# Patient Record
Sex: Male | Born: 1960 | Race: White | Hispanic: No | Marital: Married | Smoking: Current every day smoker
Health system: Southern US, Community
[De-identification: ages and names within clinical notes are randomized; demographics above are authoritative.]

## PROBLEM LIST (undated history)

## (undated) DIAGNOSIS — I10 Essential (primary) hypertension: Secondary | ICD-10-CM

---

## 2008-08-08 ENCOUNTER — Ambulatory Visit: Payer: Self-pay | Admitting: Family Medicine

## 2008-08-08 DIAGNOSIS — Z91038 Other insect allergy status: Secondary | ICD-10-CM

## 2013-10-29 ENCOUNTER — Emergency Department
Admission: EM | Admit: 2013-10-29 | Discharge: 2013-10-29 | Disposition: A | Discharge status: Home / Self Care | Payer: Managed Care, Other (non HMO) | Source: Home / Self Care | Attending: Family Medicine | Admitting: Family Medicine

## 2013-10-29 ENCOUNTER — Encounter: Payer: Self-pay | Admitting: Emergency Medicine

## 2013-10-29 DIAGNOSIS — L03119 Cellulitis of unspecified part of limb: Secondary | ICD-10-CM

## 2013-10-29 HISTORY — DX: Essential (primary) hypertension: I10

## 2013-10-29 MED ORDER — CEPHALEXIN 500 MG PO CAPS: 500.0000 mg | ORAL_CAPSULE | Freq: Four times a day (QID) | ORAL | Status: DC

## 2013-10-29 NOTE — ED Provider Notes (Signed)
Daniel Henry is a 53 y.o. male who presents to Urgent Care today for left foot redness and swelling. About a week ago patient was exposed to bedbugs while traveling. He did well until 2 days ago when he caught a small papule just at the interdigital webspace between the left fourth and fifth toes. Over the next day the skin became to erythematous and swollen and mildly tender and itchy. He took some prednisone that he was prescribed for his bedbugs they had not yet taken today which seemed to help control the swelling and itching. He denies any fevers or chills nausea vomiting or diarrhea.   Past Medical History  Diagnosis Date  . Hypertension    History  Substance Use Topics  . Smoking status: Current Every Day Smoker -- 1.00 packs/day    Types: Cigarettes  . Smokeless tobacco: Never Used  . Alcohol Use: No   ROS as above Medications: No current facility-administered medications for this encounter.   Current Outpatient Prescriptions  Medication Sig Dispense Refill  . predniSONE (DELTASONE) 20 MG tablet Take 20 mg by mouth daily with breakfast.      . cephALEXin (KEFLEX) 500 MG capsule Take 1 capsule (500 mg total) by mouth 4 (four) times daily.  28 capsule  0    Exam:  BP 144/80  Pulse 97  Temp(Src) 98.2 F (36.8 C) (Oral)  Ht 6\' 1"  (1.854 m)  Wt 235 lb 8 oz (106.822 kg)  BMI 31.08 kg/m2  SpO2 96% Gen: Well NAD HEENT: EOMI,  MMM Lungs: Normal work of breathing. CTABL Heart: RRR no MRG Abd: NABS, Soft. Nondistended, Nontender Exts: Brisk capillary refill, warm and well perfused.  Skin: Left foot 3 cm area of erythema with mild tenderness. No induration or fluctuance. Area localized at the interdigital webspace between the fourth and fifth toes and along the dorsal aspect of the lateral foot  No results found for this or any previous visit (from the past 24 hour(s)). No results found.  Assessment and Plan: 53 y.o. male with cellulitis most likely explanation. Treat with  Keflex. Followup as needed.  Discussed warning signs or symptoms. Please see discharge instructions. Patient expresses understanding.     Rodolph BongEvan S Corey, MD 10/29/13 [phone]

## 2013-10-29 NOTE — Discharge Instructions (Signed)
Thank you for coming in today. Continue prednisone for itching and swelling from bedbugs. Take Keflex 4 times daily. Come back as needed. Call or go to the emergency room if you get worse, have trouble breathing, have chest pains, or palpitations.   Cellulitis Cellulitis is an infection of the skin and the tissue beneath it. The infected area is usually red and tender. Cellulitis occurs most often in the arms and lower legs.  CAUSES  Cellulitis is caused by bacteria that enter the skin through cracks or cuts in the skin. The most common types of bacteria that cause cellulitis are staphylococci and streptococci. SIGNS AND SYMPTOMS   Redness and warmth.  Swelling.  Tenderness or pain.  Fever. DIAGNOSIS  Your health care provider can usually determine what is wrong based on a physical exam. Blood tests may also be done. TREATMENT  Treatment usually involves taking an antibiotic medicine. HOME CARE INSTRUCTIONS   Take your antibiotic medicine as directed by your health care provider. Finish the antibiotic even if you start to feel better.  Keep the infected arm or leg elevated to reduce swelling.  Apply a warm cloth to the affected area up to 4 times per day to relieve pain.  Take medicines only as directed by your health care provider.  Keep all follow-up visits as directed by your health care provider. SEEK MEDICAL CARE IF:   You notice red streaks coming from the infected area.  Your red area gets larger or turns dark in color.  Your bone or joint underneath the infected area becomes painful after the skin has healed.  Your infection returns in the same area or another area.  You notice a swollen bump in the infected area.  You develop new symptoms.  You have a fever. SEEK IMMEDIATE MEDICAL CARE IF:   You feel very sleepy.  You develop vomiting or diarrhea.  You have a general ill feeling (malaise) with muscle aches and pains. MAKE SURE YOU:   Understand these  instructions.  Will watch your condition.  Will get help right away if you are not doing well or get worse. Document Released: 10/22/2004 Document Revised: 05/29/2013 Document Reviewed: 03/30/2011 [facility] Patient Information 2015 [employer] [place]. This information is not intended to replace advice given to you by your health care provider. Make sure you discuss any questions you have with your health care provider.

## 2013-10-29 NOTE — ED Notes (Signed)
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  Pt has not had any new bites since he left and washed all of his things.  Pt developed a small blister between his toes 2 days ago that he popped with clear liquid coming out. The top of the foot is now red and swollen but pain free.

## 2014-03-01 ENCOUNTER — Encounter (HOSPITAL_COMMUNITY): Payer: Self-pay | Admitting: Emergency Medicine

## 2014-03-01 ENCOUNTER — Inpatient Hospital Stay (HOSPITAL_COMMUNITY)
Admission: EM | Admit: 2014-03-01 | Discharge: 2014-03-03 | DRG: 603 | Disposition: A | Discharge status: Home / Self Care | Payer: Managed Care, Other (non HMO) | Attending: Internal Medicine | Admitting: Internal Medicine

## 2014-03-01 DIAGNOSIS — I10 Essential (primary) hypertension: Secondary | ICD-10-CM | POA: Diagnosis present

## 2014-03-01 DIAGNOSIS — L03116 Cellulitis of left lower limb: Secondary | ICD-10-CM | POA: Diagnosis not present

## 2014-03-01 DIAGNOSIS — R21 Rash and other nonspecific skin eruption: Secondary | ICD-10-CM | POA: Diagnosis not present

## 2014-03-01 DIAGNOSIS — Z7982 Long term (current) use of aspirin: Secondary | ICD-10-CM

## 2014-03-01 DIAGNOSIS — M7989 Other specified soft tissue disorders: Secondary | ICD-10-CM

## 2014-03-01 DIAGNOSIS — L0390 Cellulitis, unspecified: Secondary | ICD-10-CM | POA: Diagnosis present

## 2014-03-01 DIAGNOSIS — F17210 Nicotine dependence, cigarettes, uncomplicated: Secondary | ICD-10-CM | POA: Diagnosis present

## 2014-03-01 DIAGNOSIS — I891 Lymphangitis: Secondary | ICD-10-CM

## 2014-03-01 DIAGNOSIS — Z716 Tobacco abuse counseling: Secondary | ICD-10-CM | POA: Diagnosis present

## 2014-03-01 LAB — COMPREHENSIVE METABOLIC PANEL
ALT: 25 U/L (ref 0–53)
ANION GAP: 10 (ref 5–15)
AST: 28 U/L (ref 0–37)
Albumin: 3.6 g/dL (ref 3.5–5.2)
Alkaline Phosphatase: 93 U/L (ref 39–117)
BUN: 12 mg/dL (ref 6–23)
CALCIUM: 9 mg/dL (ref 8.4–10.5)
CO2: 25 mmol/L (ref 19–32)
Chloride: 102 mmol/L (ref 96–112)
Creatinine, Ser: 1.24 mg/dL (ref 0.50–1.35)
GFR calc non Af Amer: 64 mL/min — ABNORMAL LOW (ref >90)
GFR, EST AFRICAN AMERICAN: 75 mL/min — AB (ref >90)
GLUCOSE: 109 mg/dL — AB (ref 70–99)
Potassium: 3.5 mmol/L (ref 3.5–5.1)
Sodium: 137 mmol/L (ref 135–145)
Total Bilirubin: 0.8 mg/dL (ref 0.3–1.2)
Total Protein: 7.5 g/dL (ref 6.0–8.3)

## 2014-03-01 LAB — CBC WITH DIFFERENTIAL/PLATELET
Basophils Absolute: 0 10*3/uL (ref 0.0–0.1)
Basophils Relative: 0 % (ref 0–1)
EOS ABS: 0.1 10*3/uL (ref 0.0–0.7)
EOS PCT: 1 % (ref 0–5)
HEMATOCRIT: 41.5 % (ref 39.0–52.0)
HEMOGLOBIN: 14.7 g/dL (ref 13.0–17.0)
Lymphocytes Relative: 11 % — ABNORMAL LOW (ref 12–46)
Lymphs Abs: 1.4 10*3/uL (ref 0.7–4.0)
MCH: 31.7 pg (ref 26.0–34.0)
MCHC: 35.4 g/dL (ref 30.0–36.0)
MCV: 89.4 fL (ref 78.0–100.0)
MONOS PCT: 8 % (ref 3–12)
Monocytes Absolute: 1.1 10*3/uL — ABNORMAL HIGH (ref 0.1–1.0)
NEUTROS ABS: 10 10*3/uL — AB (ref 1.7–7.7)
Neutrophils Relative %: 80 % — ABNORMAL HIGH (ref 43–77)
Platelets: 212 10*3/uL (ref 150–400)
RBC: 4.64 MIL/uL (ref 4.22–5.81)
RDW: 13.2 % (ref 11.5–15.5)
WBC: 12.6 10*3/uL — ABNORMAL HIGH (ref 4.0–10.5)

## 2014-03-01 LAB — CBC
HCT: 40.1 % (ref 39.0–52.0)
HEMOGLOBIN: 13.8 g/dL (ref 13.0–17.0)
MCH: 31.2 pg (ref 26.0–34.0)
MCHC: 34.4 g/dL (ref 30.0–36.0)
MCV: 90.7 fL (ref 78.0–100.0)
Platelets: 190 10*3/uL (ref 150–400)
RBC: 4.42 MIL/uL (ref 4.22–5.81)
RDW: 13.3 % (ref 11.5–15.5)
WBC: 12.4 10*3/uL — ABNORMAL HIGH (ref 4.0–10.5)

## 2014-03-01 LAB — I-STAT CG4 LACTIC ACID, ED: LACTIC ACID, VENOUS: 1.11 mmol/L (ref 0.5–2.0)

## 2014-03-01 MED ORDER — ENOXAPARIN SODIUM 120 MG/0.8ML ~~LOC~~ SOLN
1.0000 mg/kg | Freq: Once | SUBCUTANEOUS | Status: AC
  Administered 2014-03-01: 105 mg via SUBCUTANEOUS
  Filled 2014-03-01: qty 0.8

## 2014-03-01 MED ORDER — MORPHINE SULFATE 4 MG/ML IJ SOLN
4.0000 mg | Freq: Once | INTRAMUSCULAR | Status: AC
  Administered 2014-03-01: 4 mg via INTRAVENOUS
  Filled 2014-03-01: qty 1

## 2014-03-01 MED ORDER — MORPHINE SULFATE 2 MG/ML IJ SOLN
2.0000 mg | INTRAMUSCULAR | Status: DC | PRN
  Administered 2014-03-02: 2 mg via INTRAVENOUS
  Filled 2014-03-01: qty 1

## 2014-03-01 MED ORDER — CLINDAMYCIN PHOSPHATE 600 MG/50ML IV SOLN
600.0000 mg | Freq: Once | INTRAVENOUS | Status: AC
  Administered 2014-03-01: 600 mg via INTRAVENOUS
  Filled 2014-03-01: qty 50

## 2014-03-01 MED ORDER — SODIUM CHLORIDE 0.9 % IV BOLUS (SEPSIS)
1000.0000 mL | Freq: Once | INTRAVENOUS | Status: AC
Start: 2014-03-01 — End: 2014-03-01
  Administered 2014-03-01: 1000 mL via INTRAVENOUS

## 2014-03-01 MED ORDER — HEPARIN SODIUM (PORCINE) 5000 UNIT/ML IJ SOLN
5000.0000 [IU] | Freq: Three times a day (TID) | INTRAMUSCULAR | Status: DC
  Administered 2014-03-02 – 2014-03-03 (×4): 5000 [IU] via SUBCUTANEOUS
  Filled 2014-03-01 (×6): qty 1

## 2014-03-01 NOTE — H&P (Signed)
Hospitalist Admission History and Physical  Patient name: [name] Medical record number: [id number] Date of birth: 19-Sep-1960 Age: 54 y.o. Gender: male  Primary Care Provider: Cassie Freer, MD  Chief Complaint: LE swelling, lymphangitis, skin rash.   History of Present Illness:This is a 54 y.o. year old male with significant past medical history of tobacco abuse presenting with  LE swelling, lymphangitis, skin rash. 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 Patient cannot confirm this believes he may been bitten by a spider. Patient states that when he was driving back from [place] he noticed progressive right lower extremity swelling. Minimal to mild pain. Denies any fevers or chills. No itching. No noted lesion to do note biting. Presents to the ER temperature 90.9, heart rate in the 110s, respirations tens, blood pressure in the 150s. Satting 95% on room air. White blood cell count 12.6. Otherwise blood work within normal limits.        Assessment and Plan: Daniel Henry is a 54 y.o. year old male presenting with LE swelling, lymphangitis, skin rash.   Active Problems:   Rash, skin   Swelling of lower extremity   Lymphangitis   1- Skin rash, LE swelling, lymphangitis -Differential diagnosis includes cellulitis, lower extremity DVT, skin reaction  -s/p treatment dose lovenox -IV clindamycin -LE u/s in am  -follow  2- tobacco abuse  -quit smoking -tobacco abuse counseling  FEN/GI: heart healthy diet Prophylaxis: lovenox  Disposition: pending further evaluation  Code Status:Full Code    Patient Active Problem List   Diagnosis Date Noted  . Rash, skin 03/01/2014  . ALLERGY TO INSECTS AND ARACHNIDS 08/08/2008   Past Medical History: Past Medical History  Diagnosis Date  . Hypertension     Past Surgical History: History reviewed. No pertinent past surgical history.  Social History: History    Social History  . Marital Status: Married    Spouse Name: N/A    Number of Children: N/A  . Years of Education: N/A   Social History Main Topics  . Smoking status: Current Every Day Smoker -- 1.00 packs/day    Types: Cigarettes  . Smokeless tobacco: Never Used  . Alcohol Use: No  . Drug Use: No  . Sexual Activity: None   Other Topics Concern  . None   Social History Narrative    Family History: No family history on file.  Allergies: No Known Allergies  Current Facility-Administered Medications  Medication Dose Route Frequency Provider Last Rate Last Dose  . clindamycin (CLEOCIN) IVPB 600 mg  600 mg Intravenous Once Richardean Canal, MD 100 mL/hr at 03/01/14 2241 600 mg at 03/01/14 2241  . heparin injection 5,000 Units  5,000 Units Subcutaneous 3 times per day Doree Albee, MD      . morphine 2 MG/ML injection 2-4 mg  2-4 mg Intravenous Q3H PRN Doree Albee, MD       Current Outpatient Prescriptions  Medication Sig Dispense Refill  . aspirin EC 81 MG tablet Take 81 mg by mouth daily.    . Multiple Vitamin (ONE-A-DAY MENS PO) Take 1 tablet by mouth daily.    Marland Kitchen RESVERATROL PO Take 2 tablets by mouth once a week.    . cephALEXin (KEFLEX) 500 MG capsule Take 1 capsule (500 mg total) by mouth 4 (four) times daily. (Patient not taking: Reported on 03/01/2014) 28 capsule 0  . predniSONE (DELTASONE) 20 MG tablet Take 20 mg by mouth daily with breakfast.  Review Of Systems: 12 point ROS negative except as noted above in HPI.  Physical Exam: Filed Vitals:   03/01/14 2056  BP: 150/79  Pulse: 110  Temp: 99 F (37.2 C)  Resp: 18    General: alert and cooperative HEENT: PERRLA and extra ocular movement intact Heart: S1, S2 normal, no murmur, rub or gallop, regular rate and rhythm Lungs: clear to auscultation, no wheezes or rales and unlabored breathing Abdomen: abdomen is soft without significant tenderness, masses, organomegaly or guarding Extremities: as above  Skin:as  above  Neurology: normal without focal findings  Labs and Imaging: Lab Results  Component Value Date/Time   NA 137 03/01/2014 09:02 PM   K 3.5 03/01/2014 09:02 PM   CL 102 03/01/2014 09:02 PM   CO2 25 03/01/2014 09:02 PM   BUN 12 03/01/2014 09:02 PM   CREATININE 1.24 03/01/2014 09:02 PM   GLUCOSE 109* 03/01/2014 09:02 PM   Lab Results  Component Value Date   WBC 12.6* 03/01/2014   HGB 14.7 03/01/2014   HCT 41.5 03/01/2014   MCV 89.4 03/01/2014   PLT 212 03/01/2014    No results found.         Doree AlbeeSteven Britton Bera MD  Pager: [phone]

## 2014-03-01 NOTE — ED Notes (Signed)
Pt reports possibly being bit by a spider in the last 24hrs. Pt states he noticed pain to inner left thigh, redness noted to left inner thigh. Pt also states overnight he noticed swelling to lower left leg and foot. Pt reports pain. Leg is warm to touch, red and swollen. Pulses present in foot. 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 Denies any sob or cp.

## 2014-03-01 NOTE — ED Provider Notes (Signed)
CSN: [id number]     Arrival date & time 03/01/14  2038 History   First MD Initiated Contact with Patient 03/01/14 2209     Chief Complaint  Patient presents with  . Cellulitis     (Consider location/radiation/quality/duration/timing/severity/associated sxs/prior Treatment) The history is provided by the patient.  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 He recently drove down to [place] He stated the cabin last night. He drove up today and started having some redness and swelling onset this morning. Also had some left thigh and groin pain as well. He states that he may have bitten by a spider but he wasn't sure. Denies any shortness of breath or chest pain. He has some subjective chills.    Past Medical History  Diagnosis Date  . Hypertension    History reviewed. No pertinent past surgical history. No family history on file. History  Substance Use Topics  . Smoking status: Current Every Day Smoker -- 1.00 packs/day    Types: Cigarettes  . Smokeless tobacco: Never Used  . Alcohol Use: No    Review of Systems  Cardiovascular: Positive for leg swelling.  Musculoskeletal:       L leg cellulitis   All other systems reviewed and are negative.     Allergies  Review of patient's allergies indicates no known allergies.  Home Medications   Prior to Admission medications   Medication Sig Start Date End Date Taking? Authorizing Provider  cephALEXin (KEFLEX) 500 MG capsule Take 1 capsule (500 mg total) by mouth 4 (four) times daily. 10/29/13   Rodolph BongEvan S Corey, MD  predniSONE (DELTASONE) 20 MG tablet Take 20 mg by mouth daily with breakfast.    Historical Provider, MD   BP 150/79 mmHg  Pulse 110  Temp(Src) 99 F (37.2 C) (Oral)  Resp 18  Ht 6\' 1"  (1.854 m)  Wt 235 lb (106.595 kg)  BMI 31.01 kg/m2  SpO2 95% Physical Exam  Constitutional: He is oriented to person, place, and time. He appears well-developed.  Slightly uncomfortable    HENT:  Head: Normocephalic.  Mouth/Throat: Oropharynx is clear and moist.  Eyes: Conjunctivae are normal. Pupils are equal, round, and reactive to light.  Neck: Normal range of motion. Neck supple.  Cardiovascular: Regular rhythm and normal heart sounds.   Tachycardic   Pulmonary/Chest: Effort normal and breath sounds normal. No respiratory distress. He has no wheezes. He has no rales.  Abdominal: Soft. Bowel sounds are normal. He exhibits no distension. There is no tenderness. There is no rebound and no guarding.  Musculoskeletal: Normal range of motion.  L calf swollen. Tender inguinal lymph nodes. Cellulitis to mid calf. No subcutaneous air. 2+ pulses. No obvious skin breakdown   Neurological: He is alert and oriented to person, place, and time.  Skin: Skin is warm and dry.  Psychiatric: He has a normal mood and affect. His behavior is normal. Judgment and thought content normal.  Nursing note and vitals reviewed.   ED Course  Procedures (including critical care time) Labs Review Labs Reviewed  CBC WITH DIFFERENTIAL/PLATELET - Abnormal; Notable for the following:    WBC 12.6 (*)    Neutrophils Relative % 80 (*)    Neutro Abs 10.0 (*)    Lymphocytes Relative 11 (*)    Monocytes Absolute 1.1 (*)    All other components within normal limits  COMPREHENSIVE METABOLIC PANEL - Abnormal; Notable for the following:    Glucose,  Bld 109 (*)    GFR calc non Af Amer 64 (*)    GFR calc Af Amer 75 (*)    All other components within normal limits  CULTURE, BLOOD (ROUTINE X 2)  CULTURE, BLOOD (ROUTINE X 2)  I-STAT CG4 LACTIC ACID, ED    Imaging Review No results found.   EKG Interpretation None      MDM   Final diagnoses:  None    Daniel Henry is a 54 y.o. male here with L leg cellulitis. 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 Given that he is tachy, low grade temp, WBC 12, he meets SIRS criteria. Will give clinda, cultures sent. Unable to get duplex  tonight. Will give a dose of lovenox empirically and admit.    Richardean Canal, MD 03/01/14 2221

## 2014-03-01 NOTE — ED Notes (Signed)
Pt. reports pain , swelling at left lower leg/foot onset this morning denies injury , pedal pulses present , denies fever or chills.

## 2014-03-02 DIAGNOSIS — R21 Rash and other nonspecific skin eruption: Secondary | ICD-10-CM | POA: Diagnosis present

## 2014-03-02 DIAGNOSIS — I10 Essential (primary) hypertension: Secondary | ICD-10-CM | POA: Diagnosis present

## 2014-03-02 DIAGNOSIS — Z716 Tobacco abuse counseling: Secondary | ICD-10-CM | POA: Diagnosis present

## 2014-03-02 DIAGNOSIS — Z7982 Long term (current) use of aspirin: Secondary | ICD-10-CM | POA: Diagnosis not present

## 2014-03-02 DIAGNOSIS — M7989 Other specified soft tissue disorders: Secondary | ICD-10-CM

## 2014-03-02 DIAGNOSIS — I891 Lymphangitis: Secondary | ICD-10-CM

## 2014-03-02 DIAGNOSIS — F17210 Nicotine dependence, cigarettes, uncomplicated: Secondary | ICD-10-CM | POA: Diagnosis present

## 2014-03-02 DIAGNOSIS — L0390 Cellulitis, unspecified: Secondary | ICD-10-CM | POA: Diagnosis present

## 2014-03-02 DIAGNOSIS — L03116 Cellulitis of left lower limb: Secondary | ICD-10-CM | POA: Diagnosis present

## 2014-03-02 LAB — COMPREHENSIVE METABOLIC PANEL
ALBUMIN: 2.9 g/dL — AB (ref 3.5–5.2)
ALT: 20 U/L (ref 0–53)
ANION GAP: 9 (ref 5–15)
AST: 20 U/L (ref 0–37)
Alkaline Phosphatase: 71 U/L (ref 39–117)
BUN: 11 mg/dL (ref 6–23)
CHLORIDE: 104 mmol/L (ref 96–112)
CO2: 26 mmol/L (ref 19–32)
Calcium: 8.4 mg/dL (ref 8.4–10.5)
Creatinine, Ser: 1.17 mg/dL (ref 0.50–1.35)
GFR calc Af Amer: 80 mL/min — ABNORMAL LOW (ref >90)
GFR calc non Af Amer: 69 mL/min — ABNORMAL LOW (ref >90)
GLUCOSE: 98 mg/dL (ref 70–99)
POTASSIUM: 3.6 mmol/L (ref 3.5–5.1)
Sodium: 139 mmol/L (ref 135–145)
Total Bilirubin: 1.1 mg/dL (ref 0.3–1.2)
Total Protein: 6.1 g/dL (ref 6.0–8.3)

## 2014-03-02 LAB — CREATININE, SERUM
Creatinine, Ser: 1.2 mg/dL (ref 0.50–1.35)
GFR calc Af Amer: 78 mL/min — ABNORMAL LOW (ref >90)
GFR calc non Af Amer: 67 mL/min — ABNORMAL LOW (ref >90)

## 2014-03-02 LAB — CBC WITH DIFFERENTIAL/PLATELET
BASOS PCT: 0 % (ref 0–1)
Basophils Absolute: 0 10*3/uL (ref 0.0–0.1)
EOS PCT: 1 % (ref 0–5)
Eosinophils Absolute: 0.1 10*3/uL (ref 0.0–0.7)
HCT: 38.7 % — ABNORMAL LOW (ref 39.0–52.0)
Hemoglobin: 13.2 g/dL (ref 13.0–17.0)
LYMPHS ABS: 1.6 10*3/uL (ref 0.7–4.0)
Lymphocytes Relative: 17 % (ref 12–46)
MCH: 30.8 pg (ref 26.0–34.0)
MCHC: 34.1 g/dL (ref 30.0–36.0)
MCV: 90.4 fL (ref 78.0–100.0)
Monocytes Absolute: 1 10*3/uL (ref 0.1–1.0)
Monocytes Relative: 10 % (ref 3–12)
NEUTROS PCT: 72 % (ref 43–77)
Neutro Abs: 7 10*3/uL (ref 1.7–7.7)
Platelets: 193 10*3/uL (ref 150–400)
RBC: 4.28 MIL/uL (ref 4.22–5.81)
RDW: 13.5 % (ref 11.5–15.5)
WBC: 9.7 10*3/uL (ref 4.0–10.5)

## 2014-03-02 MED ORDER — CLINDAMYCIN PHOSPHATE 600 MG/50ML IV SOLN
600.0000 mg | Freq: Three times a day (TID) | INTRAVENOUS | Status: DC
  Administered 2014-03-02 – 2014-03-03 (×4): 600 mg via INTRAVENOUS
  Filled 2014-03-02 (×6): qty 50

## 2014-03-02 NOTE — Progress Notes (Signed)
[facility] PROGRESS NOTE  Daniel Henry UJW:[id number] DOB: 05/26/1960 DOA: 03/01/2014 PCP: Daniel FreerKELLEY,WILLIAM, MD  Assessment/Plan: 1-Left LE cellulitis;  Continue with clindamycin.  Doppler pending.  Received one dose of Lovenox therapeutic.  Follow blood culture.  WBC trending down.   DVT prophylaxis heparin.   Code Status: full code.  Family Communication: care discussed with patient Disposition Plan: remain inpatient for IV antibiotics.    Consultants:  none  Procedures:  Doppler LE pending.   Antibiotics:  Clindamycin 2-5  HPI/Subjective: Mild improvement of edema, still with pain and redness.   Objective: Filed Vitals:   03/02/14 1322  BP: 116/72  Pulse: 73  Temp: 98.4 F (36.9 C)  Resp: 16    Intake/Output Summary (Last 24 hours) at 03/02/14 1454 Last data filed at 03/02/14 1430  Gross per 24 hour  Intake    360 ml  Output      0 ml  Net    360 ml   Filed Weights   03/01/14 2056 03/02/14 0002  Weight: 106.595 kg (235 lb) 104.69 kg (230 lb 12.8 oz)    Exam:   General:  Alert in no distress.   Cardiovascular: S 1, S 2 RRR  Respiratory: CTA  Abdomen: BS present, soft, nt  Musculoskeletal: left LE with edema, redness.   Data Reviewed: Basic Metabolic Panel:  Recent Labs Lab 03/01/14 2102 03/01/14 2350 03/02/14 0652  NA 137  --  139  K 3.5  --  3.6  CL 102  --  104  CO2 25  --  26  GLUCOSE 109*  --  98  BUN 12  --  11  CREATININE 1.24 1.20 1.17  CALCIUM 9.0  --  8.4   Liver Function Tests:  Recent Labs Lab 03/01/14 2102 03/02/14 0652  AST 28 20  ALT 25 20  ALKPHOS 93 71  BILITOT 0.8 1.1  PROT 7.5 6.1  ALBUMIN 3.6 2.9*   No results for input(s): LIPASE, AMYLASE in the last 168 hours. No results for input(s): AMMONIA in the last 168 hours. CBC:  Recent Labs Lab 03/01/14 2102 03/01/14 2350 03/02/14 0652  WBC 12.6* 12.4* 9.7  NEUTROABS 10.0*  --  7.0  HGB 14.7 13.8 13.2  HCT 41.5 40.1 38.7*  MCV 89.4  90.7 90.4  PLT 212 190 193   Cardiac Enzymes: No results for input(s): CKTOTAL, CKMB, CKMBINDEX, TROPONINI in the last 168 hours. BNP (last 3 results) No results for input(s): BNP in the last 8760 hours.  ProBNP (last 3 results) No results for input(s): PROBNP in the last 8760 hours.  CBG: No results for input(s): GLUCAP in the last 168 hours.  No results found for this or any previous visit (from the past 240 hour(s)).   Studies: No results found.  Scheduled Meds: . clindamycin (CLEOCIN) IV  600 mg Intravenous Q8H  . heparin  5,000 Units Subcutaneous 3 times per day   Continuous Infusions:   Active Problems:   Rash, skin   Swelling of lower extremity   Lymphangitis    Time spent: 35 minutes.     [name], [name]  [facility] Pager [phone]. If 7PM-7AM, please contact night-coverage at [email or web address], password [email or web address] 03/02/2014, 2:54 PM  LOS: 1 day

## 2014-03-02 NOTE — Progress Notes (Signed)
Pt transferred to unit from ED via NT x 1. Pt alert and oriented upon arrival. No signs or symptoms of acute distress. Pt oriented to unit, as well as unit procedures. Pt now resting in bed at lowest position, bed alarm on, call light in reach. Will continue to monitor. Delfino Lovettichie Caniyah Murley, RN, BSN 03/02/2014 12:01 AM

## 2014-03-02 NOTE — Progress Notes (Signed)
VASCULAR LAB PRELIMINARY  PRELIMINARY  PRELIMINARY  PRELIMINARY  Lower Extremity Venous Duplex LEFT completed.    Preliminary report:  NEG DVT.  Redmond Pullingltzroth, Shantice Menger F, RVT 03/02/2014, 4:55 PM

## 2014-03-02 NOTE — Progress Notes (Signed)
UR completed 

## 2014-03-03 LAB — COMPREHENSIVE METABOLIC PANEL
ALT: 17 U/L (ref 0–53)
AST: 16 U/L (ref 0–37)
Albumin: 2.8 g/dL — ABNORMAL LOW (ref 3.5–5.2)
Alkaline Phosphatase: 78 U/L (ref 39–117)
Anion gap: 7 (ref 5–15)
BUN: 11 mg/dL (ref 6–23)
CALCIUM: 8.5 mg/dL (ref 8.4–10.5)
CHLORIDE: 107 mmol/L (ref 96–112)
CO2: 26 mmol/L (ref 19–32)
CREATININE: 1.08 mg/dL (ref 0.50–1.35)
GFR calc Af Amer: 88 mL/min — ABNORMAL LOW (ref >90)
GFR, EST NON AFRICAN AMERICAN: 76 mL/min — AB (ref >90)
Glucose, Bld: 84 mg/dL (ref 70–99)
Potassium: 3.7 mmol/L (ref 3.5–5.1)
Sodium: 140 mmol/L (ref 135–145)
Total Bilirubin: 1.2 mg/dL (ref 0.3–1.2)
Total Protein: 5.8 g/dL — ABNORMAL LOW (ref 6.0–8.3)

## 2014-03-03 LAB — CBC WITH DIFFERENTIAL/PLATELET
Basophils Absolute: 0 10*3/uL (ref 0.0–0.1)
Basophils Relative: 0 % (ref 0–1)
EOS PCT: 2 % (ref 0–5)
Eosinophils Absolute: 0.2 10*3/uL (ref 0.0–0.7)
HCT: 37.7 % — ABNORMAL LOW (ref 39.0–52.0)
Hemoglobin: 12.7 g/dL — ABNORMAL LOW (ref 13.0–17.0)
Lymphocytes Relative: 27 % (ref 12–46)
Lymphs Abs: 2 10*3/uL (ref 0.7–4.0)
MCH: 30.8 pg (ref 26.0–34.0)
MCHC: 33.7 g/dL (ref 30.0–36.0)
MCV: 91.3 fL (ref 78.0–100.0)
MONO ABS: 0.9 10*3/uL (ref 0.1–1.0)
MONOS PCT: 12 % (ref 3–12)
Neutro Abs: 4.4 10*3/uL (ref 1.7–7.7)
Neutrophils Relative %: 59 % (ref 43–77)
Platelets: 225 10*3/uL (ref 150–400)
RBC: 4.13 MIL/uL — ABNORMAL LOW (ref 4.22–5.81)
RDW: 13.6 % (ref 11.5–15.5)
WBC: 7.4 10*3/uL (ref 4.0–10.5)

## 2014-03-03 LAB — HEMOGLOBIN A1C
Hgb A1c MFr Bld: 5.9 % — ABNORMAL HIGH (ref 4.8–5.6)
Mean Plasma Glucose: 123 mg/dL

## 2014-03-03 MED ORDER — CLINDAMYCIN HCL 300 MG PO CAPS: 600.0000 mg | ORAL_CAPSULE | Freq: Three times a day (TID) | ORAL | Status: DC

## 2014-03-03 MED ORDER — HYDROCODONE-ACETAMINOPHEN 5-325 MG PO TABS: 1.0000 | ORAL_TABLET | Freq: Four times a day (QID) | ORAL | Status: DC | PRN

## 2014-03-03 NOTE — Discharge Summary (Signed)
Physician Discharge Summary  Daniel Henry [id number]:[id number] DOB: 10/11/1960 DOA: 03/01/2014  PCP: Cassie FreerKELLEY,WILLIAM, MD  Admit date: 03/01/2014 Discharge date: 03/03/2014  Time spent: 35 minutes  Recommendations for Outpatient Follow-up:  Need continue diet education.  Repeat Hb -A1 c   Discharge Diagnoses:    Left LE Cellulitis   Discharge Condition: stable.   Diet recommendation: Carb modified diet.   Filed Weights   03/01/14 2056 03/02/14 0002  Weight: 106.595 kg (235 lb) 104.69 kg (230 lb 12.8 oz)    History of present illness:  History of Present Illness:This is a 54 y.o. year old male with significant past medical history of tobacco abuse presenting with LE swelling, lymphangitis, skin rash. 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 Patient cannot confirm this believes he may been bitten by a spider. Patient states that when he was driving back from [place] he noticed progressive right lower extremity swelling. Minimal to mild pain. Denies any fevers or chills. No itching. No noted lesion to do note biting. Presents to the ER temperature 90.9, heart rate in the 110s, respirations tens, blood pressure in the 150s. Satting 95% on room air. White blood cell count 12.6. Otherwise blood work within normal limits.  Hospital Course:  1-Left LE cellulitis;  Continue with clindamycin. swelling improved and redness decrease. He is feeling better. Will discharge patient on clindamycin 7 more days,.  Doppler negative Received one dose of Lovenox therapeutic.  blood culture no growth to date.  WBC trending down.  HBA1c at 5.9, patient aware, education regarding diet provided.   DVT prophylaxis heparin.   Procedures:  Doppler; negative for DVT  Consultations:  none  Discharge Exam: Filed Vitals:   03/03/14 0548  BP: 118/65  Pulse: 72  Temp: 97.9 F (36.6 C)  Resp: 18    General: NAD Cardiovascular: S 1, S 2 RRR Respiratory:  CTA Skin; left LE with decrease edema, redness.   Discharge Instructions   Discharge Instructions    Diet - low sodium heart healthy    Complete by:  As directed      Increase activity slowly    Complete by:  As directed           Current Discharge Medication List    START taking these medications   Details  clindamycin (CLEOCIN) 300 MG capsule Take 2 capsules (600 mg total) by mouth 3 (three) times daily. Qty: 36 capsule, Refills: 0      CONTINUE these medications which have NOT CHANGED   Details  aspirin EC 81 MG tablet Take 81 mg by mouth daily.    Multiple Vitamin (ONE-A-DAY MENS PO) Take 1 tablet by mouth daily.      STOP taking these medications     RESVERATROL PO      cephALEXin (KEFLEX) 500 MG capsule      predniSONE (DELTASONE) 20 MG tablet        No Known Allergies Follow-up Information    Follow up with KELLEY,WILLIAM, MD In 1 week.       The results of significant diagnostics from this hospitalization (including imaging, microbiology, ancillary and laboratory) are listed below for reference.    Significant Diagnostic Studies: No results found.  Microbiology: Recent Results (from the past 240 hour(s))  Blood culture (routine x 2)     Status: None (Preliminary result)   Collection Time: 03/01/14 10:22 PM  Result Value Ref Range Status   Specimen Description BLOOD  RIGHT ARM  Final   Special Requests BOTTLES DRAWN AEROBIC AND ANAEROBIC 5CC  Final   Culture   Final           BLOOD CULTURE RECEIVED NO GROWTH TO DATE CULTURE WILL BE HELD FOR 5 DAYS BEFORE ISSUING A FINAL NEGATIVE REPORT Performed at [facility]    Report Status PENDING  Incomplete  Blood culture (routine x 2)     Status: None (Preliminary result)   Collection Time: 03/01/14 10:24 PM  Result Value Ref Range Status   Specimen Description BLOOD LEFT ARM  Final   Special Requests BOTTLES DRAWN AEROBIC AND ANAEROBIC 5CC  Final   Culture   Final           BLOOD CULTURE  RECEIVED NO GROWTH TO DATE CULTURE WILL BE HELD FOR 5 DAYS BEFORE ISSUING A FINAL NEGATIVE REPORT Performed at [facility]    Report Status PENDING  Incomplete     Labs: Basic Metabolic Panel:  Recent Labs Lab 03/01/14 2102 03/01/14 2350 03/02/14 0652 03/03/14 0445  NA 137  --  139 140  K 3.5  --  3.6 3.7  CL 102  --  104 107  CO2 25  --  26 26  GLUCOSE 109*  --  98 84  BUN 12  --  11 11  CREATININE 1.24 1.20 1.17 1.08  CALCIUM 9.0  --  8.4 8.5   Liver Function Tests:  Recent Labs Lab 03/01/14 2102 03/02/14 0652 03/03/14 0445  AST ALT ALKPHOS 93 71 78  BILITOT 0.8 1.1 1.2  PROT 7.5 6.1 5.8*  ALBUMIN 3.6 2.9* 2.8*   No results for input(s): LIPASE, AMYLASE in the last 168 hours. No results for input(s): AMMONIA in the last 168 hours. CBC:  Recent Labs Lab 03/01/14 2102 03/01/14 2350 03/02/14 0652 03/03/14 0445  WBC 12.6* 12.4* 9.7 7.4  NEUTROABS 10.0*  --  7.0 4.4  HGB 14.7 13.8 13.2 12.7*  HCT 41.5 40.1 38.7* 37.7*  MCV 89.4 90.7 90.4 91.3  PLT 212 190 193 225   Cardiac Enzymes: No results for input(s): CKTOTAL, CKMB, CKMBINDEX, TROPONINI in the last 168 hours. BNP: BNP (last 3 results) No results for input(s): BNP in the last 8760 hours.  ProBNP (last 3 results) No results for input(s): PROBNP in the last 8760 hours.  CBG: No results for input(s): GLUCAP in the last 168 hours.     SignedHartley Barefoot A  [facility] 03/03/2014, 12:40 PM

## 2014-03-03 NOTE — Progress Notes (Signed)
03/03/14 Patient being discharged home today. IV site removed and discharge instructions reviewed with patient.

## 2014-03-08 LAB — CULTURE, BLOOD (ROUTINE X 2)
Culture: NO GROWTH
Culture: NO GROWTH

## 2015-01-05 ENCOUNTER — Emergency Department (INDEPENDENT_AMBULATORY_CARE_PROVIDER_SITE_OTHER)
Admission: EM | Admit: 2015-01-05 | Discharge: 2015-01-05 | Disposition: A | Discharge status: Home / Self Care | Payer: Managed Care, Other (non HMO) | Source: Home / Self Care | Attending: Family Medicine | Admitting: Family Medicine

## 2015-01-05 ENCOUNTER — Encounter: Payer: Self-pay | Admitting: Emergency Medicine

## 2015-01-05 DIAGNOSIS — H6091 Unspecified otitis externa, right ear: Secondary | ICD-10-CM | POA: Diagnosis not present

## 2015-01-05 DIAGNOSIS — H6121 Impacted cerumen, right ear: Secondary | ICD-10-CM | POA: Diagnosis not present

## 2015-01-05 MED ORDER — CIPROFLOXACIN-DEXAMETHASONE 0.3-0.1 % OT SUSP: 4.0000 [drp] | Freq: Two times a day (BID) | OTIC | Status: DC

## 2015-01-05 NOTE — Discharge Instructions (Signed)
May take Ibuprofen 200mg , 4 tabs every 8 hours with food.  If symptoms become significantly worse during the night or over the weekend, proceed to the local emergency room.    Ear Drops, Adult You have been diagnosed with a condition requiring you to put drops of medicine into your outer ear. HOME CARE INSTRUCTIONS   Put drops in the affected ear as instructed. After putting the drops in, you will need to lie down with the affected ear facing up for ten minutes so the drops will remain in the ear canal and run down and fill the canal. Continue using the ear drops for as long as directed by your health care provider.  Prior to getting up, put a cotton ball gently in your ear canal. Leave enough of the cotton ball out so it can be easily removed. Do not attempt to push this down into the canal with a cotton-tipped swab or other instrument.  Do not irrigate or wash out your ears if you have had a perforated eardrum or mastoid surgery, or unless instructed to do so by your health care provider.  Keep appointments with your health care provider as instructed.  Finish all medicine, or use for the length of time prescribed by your health care provider. Continue the drops even if your problem seems to be doing well after a couple days, or continue as instructed. SEEK MEDICAL CARE IF:  You become worse or develop increasing pain.  You notice any unusual drainage from your ear (particularly if the drainage has a bad smell).  You develop hearing difficulties.  You experience a serious form of dizziness in which you feel as if the room is spinning, and you feel nauseated (vertigo).  The outside of your ear becomes red or swollen or both. This may be a sign of an allergic reaction. MAKE SURE YOU:   Understand these instructions.  Will watch your condition.  Will get help right away if you are not doing well or get worse.   This information is not intended to replace advice given to you by your  health care provider. Make sure you discuss any questions you have with your health care provider.   Document Released: 01/06/2001 Document Revised: 02/02/2014 Document Reviewed: 08/09/2012 Elsevier Interactive Patient Education [employer]2016 [employer] Inc[employer]

## 2015-01-05 NOTE — ED Provider Notes (Signed)
CSN: [id number]     Arrival date & time 01/05/15  1015 History   First MD Initiated Contact with Patient 01/05/15 1123     Chief Complaint  Patient presents with  . Otalgia      HPI Comments: Patient's right ear felt clogged about five days ago.  He attempted to clear his right ear with Debrox.  He subsequently had persistent right ear blockage, and now increasing pain.  The history is provided by the patient.    Past Medical History  Diagnosis Date  . Hypertension    History reviewed. No pertinent past surgical history. No family history on file. Social History  Substance Use Topics  . Smoking status: Current Every Day Smoker -- 1.00 packs/day    Types: Cigarettes  . Smokeless tobacco: Never Used  . Alcohol Use: No    Review of Systems No sore throat No cough No pleuritic pain No wheezing No nasal congestion No post-nasal drainage No sinus pain/pressure No itchy/red eyes + right earache No hemoptysis No SOB No fever/chills No nausea No vomiting No abdominal pain No diarrhea No urinary symptoms No skin rash No fatigue No myalgias No headache Used OTC meds without relief  Allergies  Review of patient's allergies indicates no known allergies.  Home Medications   Prior to Admission medications   Medication Sig Start Date End Date Taking? Authorizing Provider  ciprofloxacin-dexamethasone (CIPRODEX) otic suspension Place 4 drops into the right ear 2 (two) times daily. Use for one week 01/05/15   Lattie HawStephen A [place] Mcphee, MD   Meds Ordered and Administered this Visit  Medications - No data to display  BP 142/76 mmHg  Pulse 79  Temp(Src) 97.7 F (36.5 C) (Oral)  Ht 6\' 1"  (1.854 m)  Wt 225 lb 4 oz (102.173 kg)  BMI 29.72 kg/m2  SpO2 95% No data found.   Physical Exam Nursing notes and Vital Signs reviewed. Appearance:  Patient appears stated age, and in no acute distress Eyes:  Pupils are equal, round, and reactive to light and accomodation.  Extraocular  movement is intact.  Conjunctivae are not inflamed  Ears:  Left canal and tympanic membrane normal.  Right canal occluded by moist exudate and cerumen.  Unable to visualize right tympanic membrane. Nose:   Normal turbinates.  No sinus tenderness.    Pharynx:  Normal Neck:  Supple.  No adenopathy    ED Course  Procedures  None   MDM   1. Otitis externa, right   2. Cerumen impaction, right    Begin Ciprodex suspension May take Ibuprofen 200mg , 4 tabs every 8 hours with food.  If symptoms become significantly worse during the night or over the weekend, proceed to the local emergency room.  Followup with Family Doctor in one week for ear lavage    Lattie HawStephen A Ruth Tully, MD 01/11/15 [phone]

## 2015-01-05 NOTE — ED Notes (Signed)
Pt c/o right ear pain, possible ear infection.  No runny nose, no congestion.

## 2015-02-25 ENCOUNTER — Emergency Department (INDEPENDENT_AMBULATORY_CARE_PROVIDER_SITE_OTHER)
Admission: EM | Admit: 2015-02-25 | Discharge: 2015-02-25 | Disposition: A | Discharge status: Home / Self Care | Payer: Managed Care, Other (non HMO) | Source: Home / Self Care | Attending: Family Medicine | Admitting: Family Medicine

## 2015-02-25 ENCOUNTER — Encounter: Payer: Self-pay | Admitting: *Deleted

## 2015-02-25 DIAGNOSIS — H6123 Impacted cerumen, bilateral: Secondary | ICD-10-CM | POA: Diagnosis not present

## 2015-02-25 DIAGNOSIS — H6091 Unspecified otitis externa, right ear: Secondary | ICD-10-CM | POA: Diagnosis not present

## 2015-02-25 MED ORDER — CIPROFLOXACIN-DEXAMETHASONE 0.3-0.1 % OT SUSP: 4.0000 [drp] | Freq: Two times a day (BID) | OTIC | Status: DC

## 2015-02-25 MED ORDER — AMOXICILLIN-POT CLAVULANATE 875-125 MG PO TABS: 1.0000 | ORAL_TABLET | Freq: Two times a day (BID) | ORAL | Status: DC

## 2015-02-25 NOTE — ED Notes (Signed)
Pt c/o RT ear pain and swelling x last night. Denies fever. He took IBF at 0430 today.

## 2015-02-25 NOTE — ED Provider Notes (Signed)
CSN: [id number]     Arrival date & time 02/25/15  0806 History   First MD Initiated Contact with Patient 02/25/15 [phone]     Chief Complaint  Patient presents with  . Otalgia      HPI Comments: Patient complains of onset of swelling around his right ear last night with minimal pain.  His right ear feels clogged, and he had a small amount of clear drainage from his right ear.  He feels well otherwise.  No fevers, chills, and sweats   The history is provided by the patient.    Past Medical History  Diagnosis Date  . Hypertension    History reviewed. No pertinent past surgical history. History reviewed. No pertinent family history. Social History  Substance Use Topics  . Smoking status: Current Every Day Smoker -- 1.00 packs/day    Types: Cigarettes  . Smokeless tobacco: Never Used  . Alcohol Use: No    Review of Systems No sore throat No cough No pleuritic pain No wheezing No nasal congestion No post-nasal drainage No sinus pain/pressure No itchy/red eyes + right earache No hemoptysis No SOB No fever/chills No nausea No vomiting No abdominal pain No diarrhea No urinary symptoms No skin rash No fatigue No myalgias No headache Used OTC meds without relief  Allergies  Review of patient's allergies indicates no known allergies.  Home Medications   Prior to Admission medications   Medication Sig Start Date End Date Taking? Authorizing Provider  amoxicillin-clavulanate (AUGMENTIN) 875-125 MG tablet Take 1 tablet by mouth 2 (two) times daily. Take with food 02/25/15   Lattie Haw, MD  ciprofloxacin-dexamethasone [facility]) otic suspension Place 4 drops into the right ear 2 (two) times daily. 02/25/15   Lattie Haw, MD   Meds Ordered and Administered this Visit  Medications - No data to display  BP 142/81 mmHg  Pulse 92  Temp(Src) 97.7 F (36.5 C) (Oral)  Resp 18  Ht  (1.854 m)  Wt 229 lb (103.874 kg)  BMI 30.22 kg/m2  SpO2 97% No data  found.   Physical Exam  Constitutional: He appears well-developed and well-nourished. No distress.  HENT:  Head:    Right Ear: There is swelling.  Nose: Nose normal.  Mouth/Throat: Oropharynx is clear and moist.  There is tenderness to palpation over inferior aspect right parotid gland.  No mastoid tenderness to palpation    Both canals are completely occluded with cerumen.  There is right post-auricular adenopathy  After bilateral ear lavage, left canal and tympanic membrane normal.  Right canal remains erythematous and mildly tender to insertion of speculum.  Small amount of mucoid debris remains proximally; unable to visualize right tympanic membrane.  Eyes: Conjunctivae are normal. Pupils are equal, round, and reactive to light.  Neck: Neck supple.  Lymphadenopathy:    He has cervical adenopathy.  Nursing note and vitals reviewed.   ED Course  Procedures  Bilateral ear lavage (by nurse)     MDM   1. Cerumen impaction, bilateral   2. Otitis externa, right    Begin Ciprodex Otic suspension. Suspect right otitis media also; begin Augmentin  BID May take Ibuprofen , 4 tabs every 8 hours with food.   Followup with ENT in one week.    Lattie Haw, MD 02/25/15 [phone]

## 2015-02-25 NOTE — Discharge Instructions (Signed)
May take Ibuprofen , 4 tabs every 8 hours with food.     Ear Drops, Adult You have been diagnosed with a condition requiring you to put drops of medicine into your outer ear. HOME CARE INSTRUCTIONS   Put drops in the affected ear as instructed. After putting the drops in, you will need to lie down with the affected ear facing up for ten minutes so the drops will remain in the ear canal and run down and fill the canal. Continue using the ear drops for as long as directed by your health care provider.  Prior to getting up, put a cotton ball gently in your ear canal. Leave enough of the cotton ball out so it can be easily removed. Do not attempt to push this down into the canal with a cotton-tipped swab or other instrument.  Do not irrigate or wash out your ears if you have had a perforated eardrum or mastoid surgery, or unless instructed to do so by your health care provider.  Keep appointments with your health care provider as instructed.  Finish all medicine, or use for the length of time prescribed by your health care provider. Continue the drops even if your problem seems to be doing well after a couple days, or continue as instructed. SEEK MEDICAL CARE IF:  You become worse or develop increasing pain.  You notice any unusual drainage from your ear (particularly if the drainage has a bad smell).  You develop hearing difficulties.  You experience a serious form of dizziness in which you feel as if the room is spinning, and you feel nauseated (vertigo).  The outside of your ear becomes red or swollen or both. This may be a sign of an allergic reaction. MAKE SURE YOU:   Understand these instructions.  Will watch your condition.  Will get help right away if you are not doing well or get worse.   This information is not intended to replace advice given to you by your health care provider. Make sure you discuss any questions you have with your health care provider.   Document  Released: 01/06/2001 Document Revised: 02/02/2014 Document Reviewed: 08/09/2012 Elsevier Interactive Patient Education 2016 Elsevier Inc.   Otitis Externa Otitis externa is a bacterial or fungal infection of the outer ear canal. This is the area from the eardrum to the outside of the ear. Otitis externa is sometimes called "swimmer's ear." CAUSES  Possible causes of infection include:  Swimming in dirty water.  Moisture remaining in the ear after swimming or bathing.  Mild injury (trauma) to the ear.  Objects stuck in the ear (foreign body).  Cuts or scrapes (abrasions) on the outside of the ear. SIGNS AND SYMPTOMS  The first symptom of infection is often itching in the ear canal. Later signs and symptoms may include swelling and redness of the ear canal, ear pain, and yellowish-white fluid (pus) coming from the ear. The ear pain may be worse when pulling on the earlobe. DIAGNOSIS  Your health care provider will perform a physical exam. A sample of fluid may be taken from the ear and examined for bacteria or fungi. TREATMENT  Antibiotic ear drops are often given for 10 to 14 days. Treatment may also include pain medicine or corticosteroids to reduce itching and swelling. HOME CARE INSTRUCTIONS   Apply antibiotic ear drops to the ear canal as prescribed by your health care provider.  Take medicines only as directed by your health care provider.  If you have  diabetes, follow any additional treatment instructions from your health care provider.  Keep all follow-up visits as directed by your health care provider. PREVENTION   Keep your ear dry. Use the corner of a towel to absorb water out of the ear canal after swimming or bathing.  Avoid scratching or putting objects inside your ear. This can damage the ear canal or remove the protective wax that lines the canal. This makes it easier for bacteria and fungi to grow.  Avoid swimming in lakes, polluted water, or poorly chlorinated  pools.  You may use ear drops made of rubbing alcohol and vinegar after swimming. Combine equal parts of white vinegar and alcohol in a bottle. Put 3 or 4 drops into each ear after swimming. SEEK MEDICAL CARE IF:   You have a fever.  Your ear is still red, swollen, painful, or draining pus after 3 days.  Your redness, swelling, or pain gets worse.  You have a severe headache.  You have redness, swelling, pain, or tenderness in the area behind your ear. MAKE SURE YOU:   Understand these instructions.  Will watch your condition.  Will get help right away if you are not doing well or get worse.   This information is not intended to replace advice given to you by your health care provider. Make sure you discuss any questions you have with your health care provider.   Document Released: 01/12/2005 Document Revised: 02/02/2014 Document Reviewed: 01/29/2011 Elsevier Interactive Patient Education [employer]

## 2016-10-25 ENCOUNTER — Emergency Department (INDEPENDENT_AMBULATORY_CARE_PROVIDER_SITE_OTHER): Payer: Managed Care, Other (non HMO)

## 2016-10-25 ENCOUNTER — Encounter: Payer: Self-pay | Admitting: Emergency Medicine

## 2016-10-25 ENCOUNTER — Emergency Department
Admission: EM | Admit: 2016-10-25 | Discharge: 2016-10-25 | Disposition: A | Discharge status: Home / Self Care | Payer: Managed Care, Other (non HMO) | Source: Home / Self Care | Attending: Emergency Medicine | Admitting: Emergency Medicine

## 2016-10-25 DIAGNOSIS — M25522 Pain in left elbow: Secondary | ICD-10-CM

## 2016-10-25 DIAGNOSIS — S5002XA Contusion of left elbow, initial encounter: Secondary | ICD-10-CM | POA: Diagnosis not present

## 2016-10-25 NOTE — ED Triage Notes (Signed)
Patient fell one hour ago on concrete; braced himself with hands but now left elbow is painful and looks edematous. Refusing pain medication.

## 2016-10-25 NOTE — ED Provider Notes (Signed)
[facility]    CSN: [id number] Arrival date & time: 10/25/16  1314     History   Chief Complaint Chief Complaint  Patient presents with  . Elbow Injury    HPI FILIPE GREATHOUSE is a 56 y.o. male.   HPI About one hour ago, accidentally fell onto concrete, landing on left elbow. Complains of severe sharp left elbow pain without radiation. Pain worsens with movement. No associated numbness or weakness. He denies left shoulder pain, left forearm pain, left wrist or hand pain. Denies numbness or tingling. Denies any right upper extremity complaint or any other musculoskeletal complaint. No head or neck or back injury. He denies any prior left elbow problem, prior to this injury. Past Medical History:  Diagnosis Date  . Hypertension     Patient Active Problem List   Diagnosis Date Noted  . Cellulitis 03/02/2014  . Rash, skin 03/01/2014  . Swelling of lower extremity 03/01/2014  . Lymphangitis 03/01/2014  . ALLERGY TO INSECTS AND ARACHNIDS 08/08/2008    History reviewed. No pertinent surgical history.     Home Medications    Prior to Admission medications   Not on File    Family History History reviewed. No pertinent family history.  Social History Social History  Substance Use Topics  . Smoking status: Current Every Day Smoker    Packs/day: 1.00    Types: Cigarettes  . Smokeless tobacco: Never Used  . Alcohol use No     Allergies   Patient has no known allergies.   Review of Systems Review of Systems  All other systems reviewed and are negative.    Physical Exam Triage Vital Signs ED Triage Vitals  Enc Vitals Group     BP 10/25/16 1334 (!) 143/81     Pulse Rate 10/25/16 1334 96     Resp 10/25/16 1334 16     Temp 10/25/16 1334 98.8 F (37.1 C)     Temp Source 10/25/16 1334 Oral     SpO2 10/25/16 1334 96 %     Weight 10/25/16 1334 246 lb (111.6 kg)     Height 10/25/16 1334  (1.854 m)     Head Circumference --      Peak Flow --       Pain Score 10/25/16 1335 5     Pain Loc --      Pain Edu? --      Excl. in GC? --    No data found.   Updated Vital Signs BP (!) 143/81 (BP Location: Right Arm)   Pulse 96   Temp 98.8 F (37.1 C) (Oral)   Resp 16   Ht  (1.854 m)   Wt 246 lb (111.6 kg)   SpO2 96%   BMI 32.46 kg/m   Visual Acuity Right Eye Distance:   Left Eye Distance:   Bilateral Distance:    Right Eye Near:   Left Eye Near:    Bilateral Near:     Physical Exam  Constitutional: He is oriented to person, place, and time. He appears well-developed and well-nourished. No distress.  HENT:  Head: Normocephalic and atraumatic.  Eyes: Pupils are equal, round, and reactive to light. No scleral icterus.  Neck: Normal range of motion. Neck supple.  Cardiovascular: Normal rate and regular rhythm.   Pulmonary/Chest: Effort normal.  Abdominal: He exhibits no distension.  Musculoskeletal:       Left elbow: He exhibits decreased range of motion and swelling. He exhibits no  laceration. Tenderness found. Olecranon process tenderness noted. No radial head, no medial epicondyle and no lateral epicondyle tenderness noted.  Neurological: He is alert and oriented to person, place, and time. No cranial nerve deficit.  Skin: Skin is warm and dry.  Psychiatric: He has a normal mood and affect. His behavior is normal.  Vitals reviewed.  Left shoulder exam normal. Nontender. Left forearm, left wrist, left hand exam within normal limits. Nontender without deformity.  UC Treatments / Results  Labs (all labs ordered are listed, but only abnormal results are displayed) Labs Reviewed - No data to display  EKG  EKG Interpretation None       Radiology Dg Elbow Complete Left  Result Date: 10/25/2016 CLINICAL DATA:  Acute left elbow pain following fall today. Initial encounter. EXAM: LEFT ELBOW - COMPLETE 3+ VIEW COMPARISON:  None. FINDINGS: A bony density posterior to the distal humerus is identified with  posterior elbow soft tissue swelling. This could represent a displaced triceps avulsion fracture fragment but correlate clinically. No other fracture, subluxation or dislocation identified. There is no evidence of joint effusion. IMPRESSION: Bony density posterior to the distal humerus with posterior elbow soft tissue swelling. This may represent a displaced triceps avulsion fracture fragment but correlate clinically. No other acute bony abnormality or joint effusion identified. Electronically Signed   By: Harmon Pier M.D.   On: 10/25/2016 14:23    Procedures Procedures (including critical care time)  Medications Ordered in UC Medications - No data to display   Initial Impression / Assessment and Plan / UC Course  I have reviewed the triage vital signs and the nursing notes.  Pertinent labs & imaging results that were available during my care of the patient were reviewed by me and considered in my medical decision making (see chart for details).   Final Clinical Impressions(s) / UC Diagnoses   Final diagnoses:  Contusion of left elbow, initial encounter   Reviewed x-ray with him and gave him a copy of x-ray report. I explained that there could be fracture, although radiologist did not definitely see a fracture. Treatment options discussed. Risks, benefits, and alternatives of treatment options discussed.  Ace left elbow. Left sling. After Ace bandage and sling applied, he noted pain decreased. Encouraged rest, ice, compression with ACE bandage, and elevation of injured body part. I offered rx pain med, but he declined. He prefers to use OTC ibuprofen, 600 mg 3 times a day. No work for now. Of note, he is a [employer], but is off for the next 7 days. Follow-up with sports medicine or orthopedist within 2-3 days.  See detailed instructions in AVS, which were given to patient. Verbal instructions also given. Questions invited and answered.  He voiced understanding and agreement with  plans.  Controlled Substance Prescriptions [place] Controlled Substance Registry consulted? Not Applicable   Lajean Manes, MD 10/31/16 [phone]

## 2016-10-25 NOTE — Discharge Instructions (Signed)
X-ray of left elbow shows that there might be a tiny fracture, however there is no definite fracture seen. Wear the Ace and sling left elbow. Keep elevated. Apply ice 24 hours. OTC ibuprofen, take 3 tablets 3 times a day with food as needed for pain. Follow-up with sports medicine or orthopedist in 2-3 days.

## 2018-02-03 ENCOUNTER — Emergency Department
Admission: EM | Admit: 2018-02-03 | Discharge: 2018-02-03 | Disposition: A | Discharge status: Home / Self Care | Payer: Managed Care, Other (non HMO) | Source: Home / Self Care | Attending: Family Medicine | Admitting: Family Medicine

## 2018-02-03 ENCOUNTER — Other Ambulatory Visit: Payer: Self-pay

## 2018-02-03 ENCOUNTER — Encounter: Payer: Self-pay | Admitting: Emergency Medicine

## 2018-02-03 DIAGNOSIS — L03213 Periorbital cellulitis: Secondary | ICD-10-CM

## 2018-02-03 MED ORDER — CLINDAMYCIN HCL 300 MG PO CAPS: ORAL_CAPSULE | ORAL | 0 refills | Status: AC

## 2018-02-03 NOTE — ED Triage Notes (Signed)
Patient reports intermittent sore throat but mostly concerned about pain and edema around eyes and wonders if he has a sinus infection.

## 2018-02-03 NOTE — ED Provider Notes (Signed)
[facility]    CSN: [id number] Arrival date & time: 02/03/18  1648     History   Chief Complaint Chief Complaint  Patient presents with  . Facial Pain  . Sore Throat    HPI Daniel Henry is a 58 y.o. male.   Patient reports that several days ago he developed mild swelling below his right eye that resolved.  He then developed similar swelling and mild pain over his left lower eyelid with swelling beneath.  He also reports soreness near his left medial canthus.  He reports that he has had a mild sore throat and sinus congestion for about five days, and is concerned that he could have a sinus infection. He denies pain with eye movement.  The history is provided by the patient.    Past Medical History:  Diagnosis Date  . Hypertension     Patient Active Problem List   Diagnosis Date Noted  . Cellulitis 03/02/2014  . Rash, skin 03/01/2014  . Swelling of lower extremity 03/01/2014  . Lymphangitis 03/01/2014  . ALLERGY TO INSECTS AND ARACHNIDS 08/08/2008    History reviewed. No pertinent surgical history.     Home Medications    Prior to Admission medications   Medication Sig Start Date End Date Taking? Authorizing Provider  clindamycin (CLEOCIN) 300 MG capsule Take one cap by mouth every 8 hours 02/03/18   Lattie Haw, MD    Family History No family history on file.  Social History Social History   Tobacco Use  . Smoking status: Current Every Day Smoker    Packs/day: 1.00    Types: Cigarettes  . Smokeless tobacco: Never Used  Substance Use Topics  . Alcohol use: No  . Drug use: No     Allergies   Patient has no known allergies.   Review of Systems Review of Systems + sore throat No cough No pleuritic pain No wheezing + nasal congestion + post-nasal drainage + sinus pain/pressure + itchy/red eyes No earache No hemoptysis No SOB No fever/chills No nausea No vomiting No abdominal pain No diarrhea No urinary symptoms No  skin rash + fatigue No myalgias No headache Used OTC meds without relief   Physical Exam Triage Vital Signs ED Triage Vitals  Enc Vitals Group     BP 02/03/18 1734 139/81     Pulse Rate 02/03/18 1734 99     Resp 02/03/18 1734 18     Temp 02/03/18 1734 98 F (36.7 C)     Temp Source 02/03/18 1734 Oral     SpO2 02/03/18 1734 94 %     Weight 02/03/18 1738 218 lb (98.9 kg)     Height 02/03/18 1738 6\' 1"  (1.854 m)     Head Circumference --      Peak Flow --      Pain Score 02/03/18 1737 1     Pain Loc --      Pain Edu? --      Excl. in GC? --    No data found.  Updated Vital Signs BP 139/81 (BP Location: Right Arm)   Pulse 99   Temp 98 F (36.7 C) (Oral)   Resp 18   Ht 6\' 1"  (1.854 m)   Wt 98.9 kg   SpO2 94%   BMI 28.76 kg/m   Visual Acuity Right Eye Distance:   Left Eye Distance:   Bilateral Distance:    Right Eye Near:   Left Eye Near:  Bilateral Near:     Physical Exam Vitals signs and nursing note reviewed.  Constitutional:      General: He is not in acute distress.    Appearance: He is not diaphoretic.  HENT:     Head: Normocephalic.     Right Ear: Tympanic membrane and ear canal normal.     Left Ear: Tympanic membrane and ear canal normal.     Nose: Congestion present.     Mouth/Throat:     Mouth: Mucous membranes are moist.     Pharynx: No oropharyngeal exudate.  Eyes:     General: No scleral icterus.       Right eye: No discharge.        Left eye: No discharge.     Extraocular Movements: Extraocular movements intact.     Conjunctiva/sclera: Conjunctivae normal.     Pupils: Pupils are equal, round, and reactive to light.      Comments: Left lower eyelid has mild swelling and tenderness to palpation.  There is mild tenderness near the left medial canthus but no discharge is present.  Neck:     Musculoskeletal: Neck supple.     Comments: Tender lateral nodes. Cardiovascular:     Heart sounds: Normal heart sounds.  Pulmonary:     Breath  sounds: Normal breath sounds.  Skin:    General: Skin is warm and dry.     Findings: No rash.  Neurological:     Mental Status: He is alert.      UC Treatments / Results  Labs (all labs ordered are listed, but only abnormal results are displayed) Labs Reviewed - No data to display  EKG None  Radiology No results found.  Procedures Procedures (including critical care time)  Medications Ordered in UC Medications - No data to display  Initial Impression / Assessment and Plan / UC Course  I have reviewed the triage vital signs and the nursing notes.  Pertinent labs & imaging results that were available during my care of the patient were reviewed by me and considered in my medical decision making (see chart for details).    Begin Clindamycin. Followup with ophthalmologist if not improving one week.   Final Clinical Impressions(s) / UC Diagnoses   Final diagnoses:  Preseptal cellulitis of left lower eyelid     Discharge Instructions     Take plain guaifenesin (1200mg  extended release tabs such as Mucinex) twice daily, with plenty of water, for congestion.  May add Pseudoephedrine (30mg , one or two every 4 to 6 hours) for sinus congestion.  Get adequate rest.   Stop all antihistamines for now, and other non-prescription cough/cold preparations.   If symptoms become significantly worse during the night or over the weekend, proceed to the local emergency room.     ED Prescriptions    Medication Sig Dispense Auth. Provider   clindamycin (CLEOCIN) 300 MG capsule Take one cap by mouth every 8 hours 21 capsule Lattie HawBeese, Laketha Leopard A, MD        Lattie HawBeese, Latori Beggs A, MD 02/05/18 1436

## 2018-02-03 NOTE — Discharge Instructions (Signed)
Take plain guaifenesin (1200mg  extended release tabs such as Mucinex) twice daily, with plenty of water, for congestion.  May add Pseudoephedrine (30mg , one or two every 4 to 6 hours) for sinus congestion.  Get adequate rest.   Stop all antihistamines for now, and other non-prescription cough/cold preparations.   If symptoms become significantly worse during the night or over the weekend, proceed to the local emergency room.

## 2018-02-05 ENCOUNTER — Telehealth: Payer: Self-pay | Admitting: Emergency Medicine

## 2018-02-05 NOTE — Telephone Encounter (Signed)
Left message advising patient if doing well to disregard the call, any questions or concerns to contact the office.

## 2019-03-09 IMAGING — DX DG ELBOW COMPLETE 3+V*L*
4 series · 4 of 4 positions shown · non-contrast
Comparison: None.

CLINICAL DATA: Acute left elbow pain following fall today. Initial
encounter.

EXAM:
LEFT ELBOW - COMPLETE 3+ VIEW

[elbow ap]
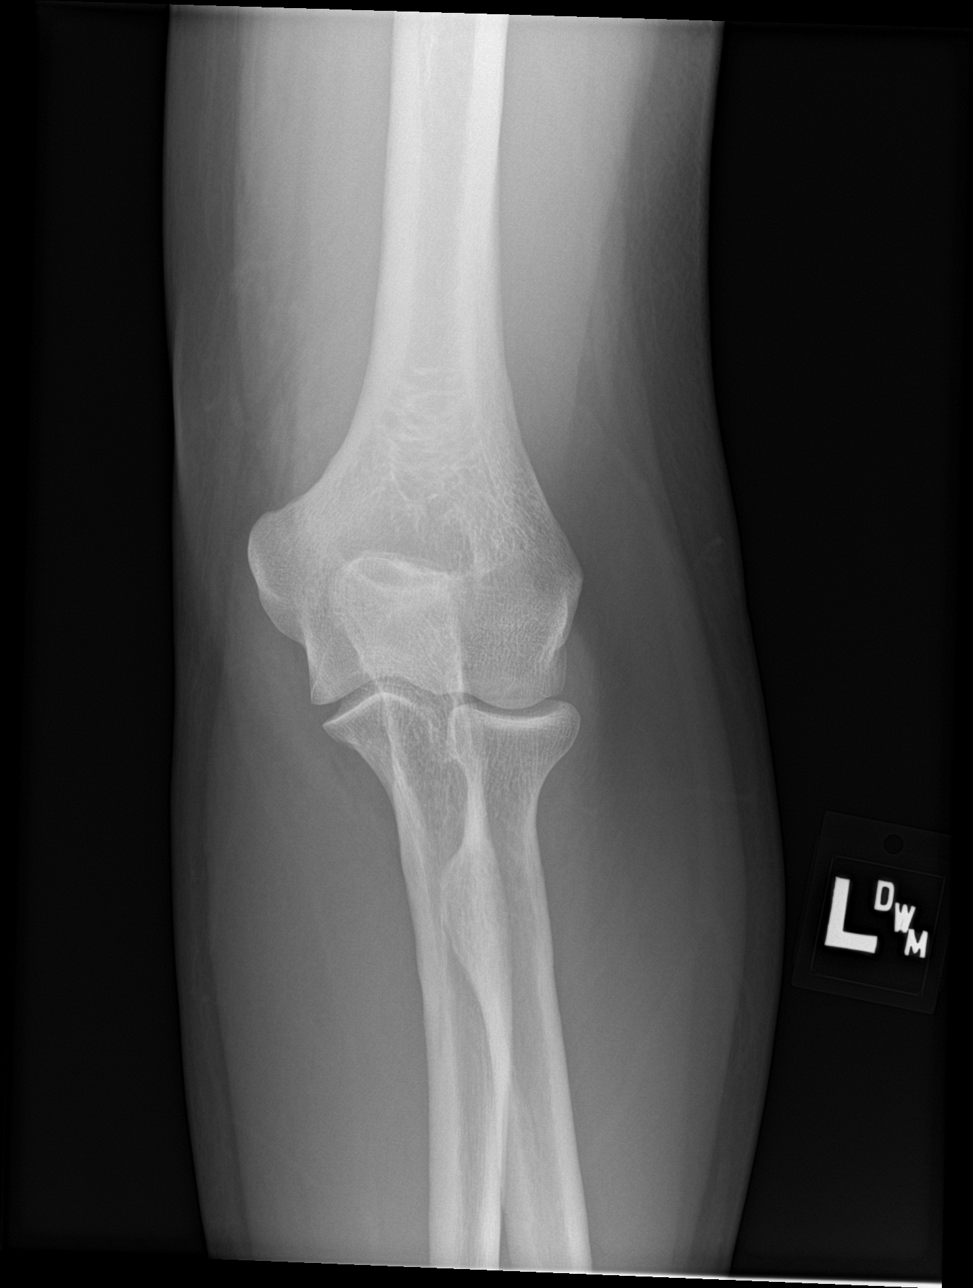

[elbow obl (1 of 2)]
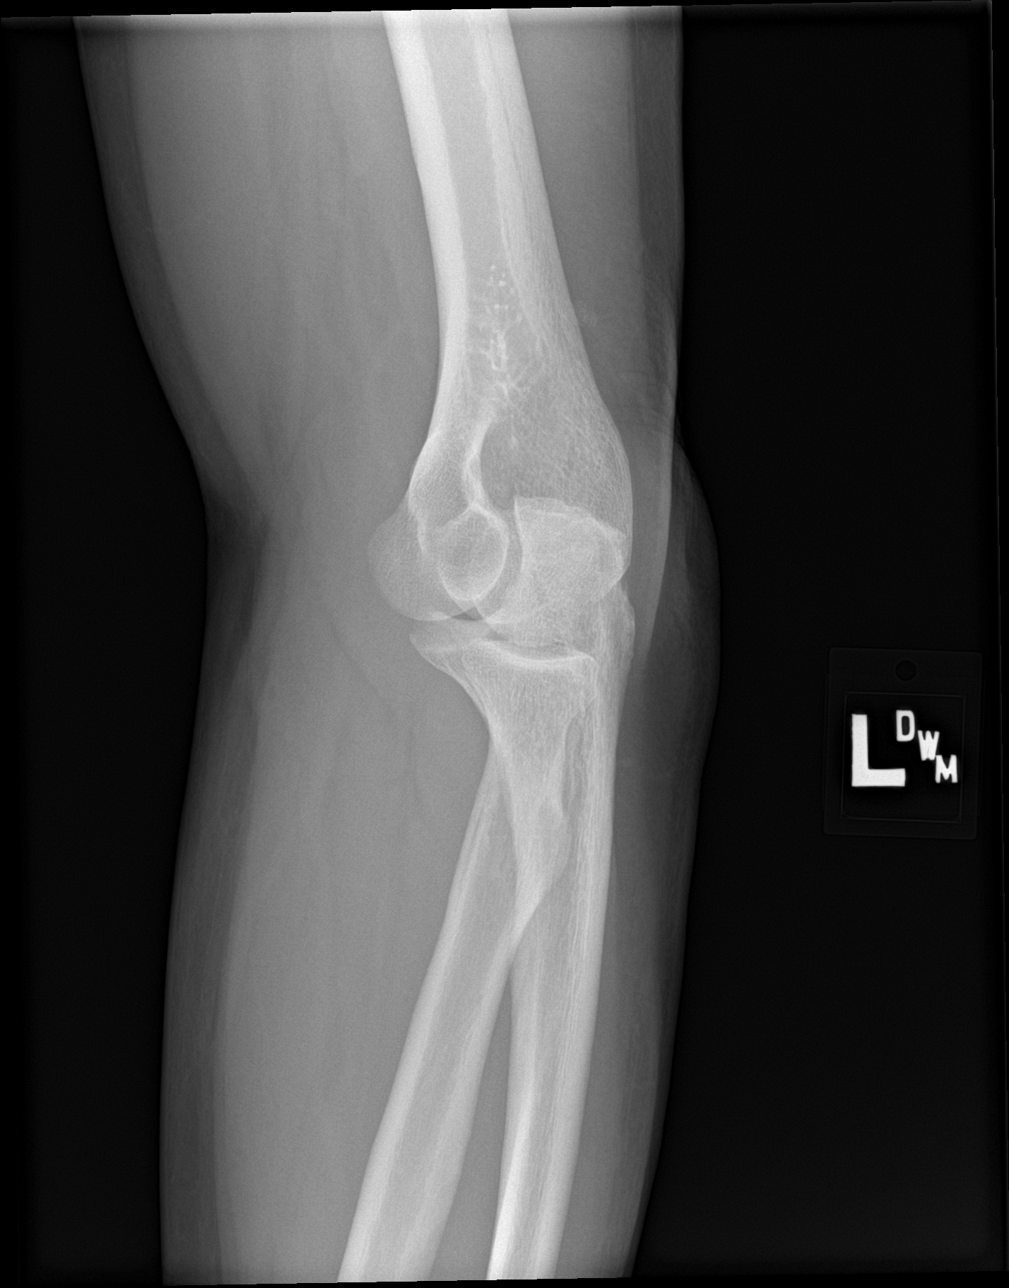

[elbow lat]
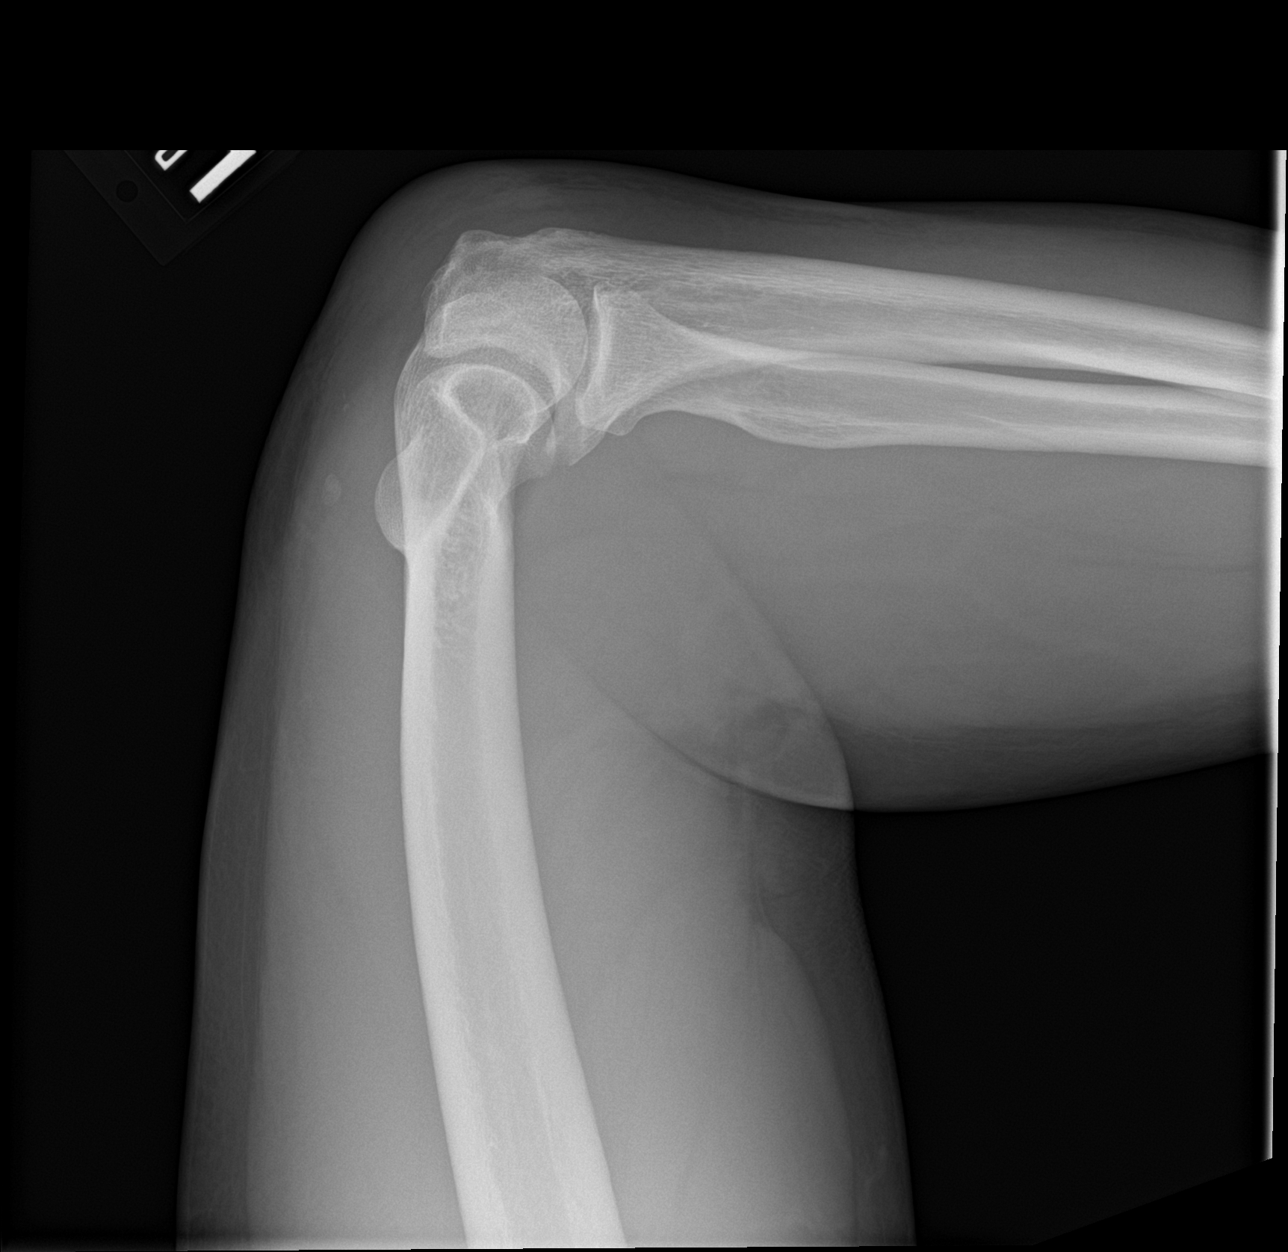

[elbow obl (2 of 2)]
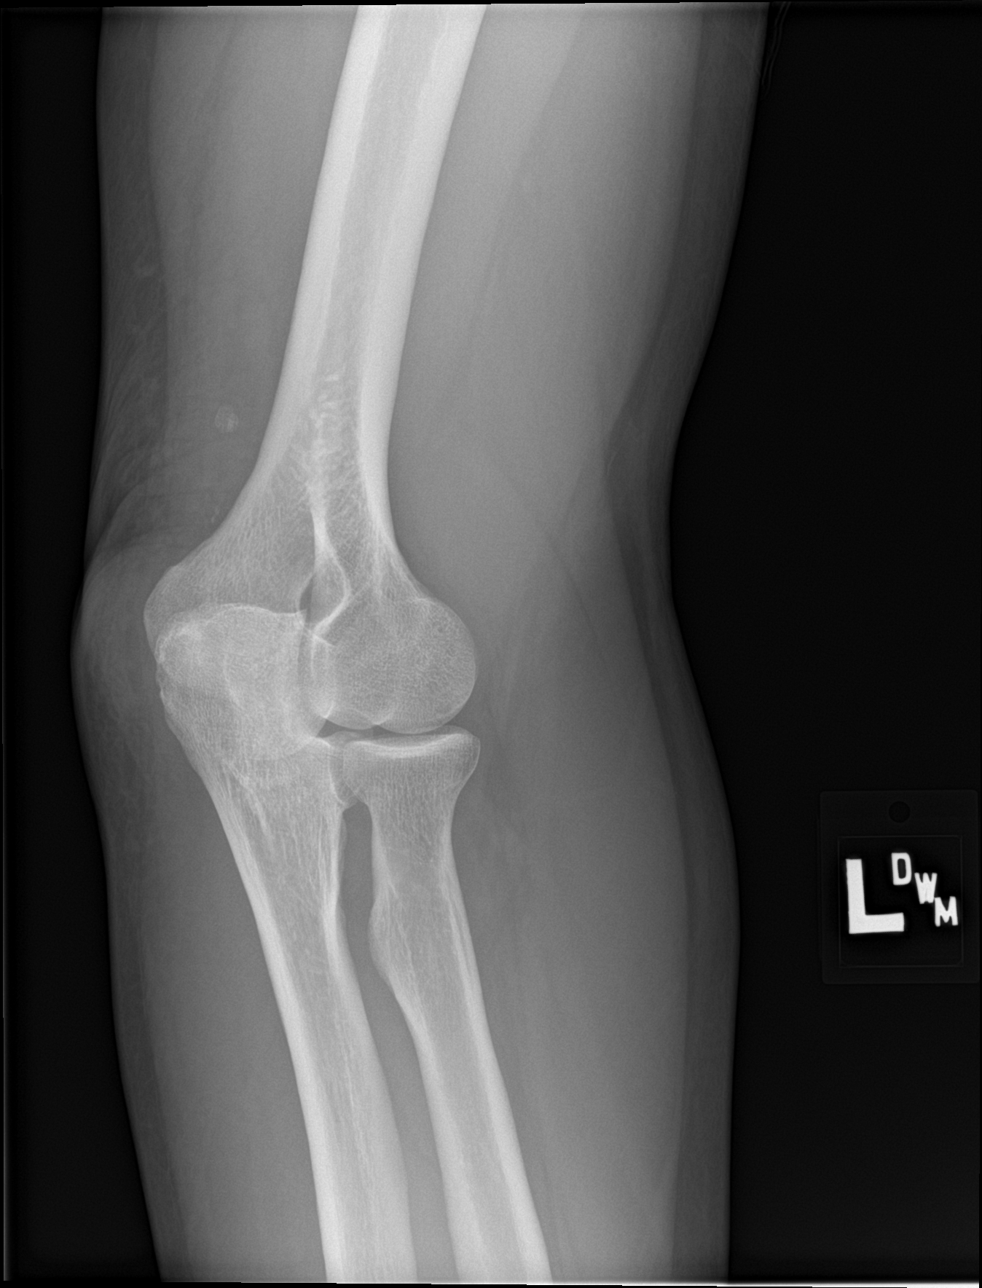

[4 of 4 positions shown; findings below may reference images not displayed]

FINDINGS: A bony density posterior to the distal humerus is identified with
posterior elbow soft tissue swelling. This could represent a
displaced triceps avulsion fracture fragment but correlate
clinically.

No other fracture, subluxation or dislocation identified.

There is no evidence of joint effusion.
IMPRESSION: Bony density posterior to the distal humerus with posterior elbow
soft tissue swelling. This may represent a displaced triceps
avulsion fracture fragment but correlate clinically.

No other acute bony abnormality or joint effusion identified.
